# Patient Record
Sex: Male | Born: 1985 | Race: Black or African American | Hispanic: No | Marital: Single | Smoking: Current every day smoker
Health system: Southern US, Community
[De-identification: ages and names within clinical notes are randomized; demographics above are authoritative.]

---

## 2019-10-01 ENCOUNTER — Other Ambulatory Visit: Payer: Self-pay

## 2019-10-01 ENCOUNTER — Ambulatory Visit (HOSPITAL_COMMUNITY)
Admission: EM | Admit: 2019-10-01 | Discharge: 2019-10-01 | Disposition: A | Discharge status: Home / Self Care | Payer: No Payment, Other | Attending: Family | Admitting: Family

## 2019-10-01 DIAGNOSIS — R45851 Suicidal ideations: Secondary | ICD-10-CM | POA: Insufficient documentation

## 2019-10-01 DIAGNOSIS — Z5900 Homelessness unspecified: Secondary | ICD-10-CM

## 2019-10-01 NOTE — Discharge Instructions (Addendum)
°  Follow-up with [facility] in [place]. Discussed safety plan and crisis options, including dialing 911 for urgent needs.

## 2019-10-01 NOTE — ED Provider Notes (Signed)
Behavioral Health Medical Screening Exam  Cory Horton is a 34 y.o. male.  Patient arrives to [facility] behavioral urgent care for walk-in assessment, patient voluntary. Patient reports frustration that he was seen by [facility] social services today and promised housing but he remains homeless. Patient reports chronic suicidal ideations, states "I have thought about having the police shooting kill me because I would never kill myself."  Patient reports "I have felt this way for many years." Patient denies homicidal ideations.  Patient denies auditory and visual hallucinations.  Patient denies symptoms of paranoia. Patient reports he is currently homeless in [place].  Patient denies access to weapons.  Patient reports he is currently employed at "[employer]."  Patient denies alcohol and substance use. Patient reports he was recently discharged from prison after approximately 9 years.  Patient reports he speaks with his psychotherapist every Thursday.  Patient reports he looks forward to therapy on Thursdays as it is a positive in his life. Patient continues to ruminate about homeless status. Patient denies any natural supports for collateral information in the area. Patient contracts verbally for safety.  Patient future oriented, states he plans to report to work tomorrow.   Total Time spent with patient: 45 minutes  Psychiatric Specialty Exam  Presentation  General Appearance:Casual  Eye Contact:Good  Speech:Normal Rate  Speech Volume:Increased  Handedness:Right   Mood and Affect  Mood:Labile  Affect:Labile   Thought Process  Thought Processes:Coherent;Goal Directed  Descriptions of Associations:Intact  Orientation:Full (Time, Place and Person)  Thought Content:WDL  Hallucinations:None  Ideas of Reference:None  Suicidal Thoughts:Yes, Passive Without Intent  Homicidal Thoughts:No   Sensorium  Memory:Immediate Good;Remote  Good  Judgment:Fair  Insight:Fair   Executive Functions  Concentration:Good  Attention Span:Fair  Recall:Fair  Fund of Knowledge:Fair  Language:Fair   Psychomotor Activity  Psychomotor Activity:No data recorded  Assets  Assets:Desire for Improvement;Physical Health;Resilience;Social Support   Sleep  Sleep:Fair  Number of hours: No data recorded  Physical Exam: Physical Exam Vitals and nursing note reviewed.  Constitutional:      Appearance: He is well-developed.  HENT:     Head: Normocephalic and atraumatic.  Eyes:     Conjunctiva/sclera: Conjunctivae normal.  Cardiovascular:     Rate and Rhythm: Normal rate and regular rhythm.     Heart sounds: No murmur heard.   Pulmonary:     Effort: Pulmonary effort is normal. No respiratory distress.     Breath sounds: Normal breath sounds.  Abdominal:     Palpations: Abdomen is soft.     Tenderness: There is no abdominal tenderness.  Musculoskeletal:     Cervical back: Neck supple.  Skin:    General: Skin is warm and dry.  Neurological:     Mental Status: He is alert.  Psychiatric:        Attention and Perception: Attention and perception normal.        Mood and Affect: Affect is labile.        Speech: Speech is rapid and pressured.        Behavior: Behavior normal. Behavior is cooperative.        Thought Content: Thought content normal.        Cognition and Memory: Cognition normal.        Judgment: Judgment normal.    Review of Systems  Constitutional: Negative.   HENT: Negative.   Eyes: Negative.   Respiratory: Negative.   Cardiovascular: Negative.   Gastrointestinal: Negative.   Genitourinary: Negative.   Musculoskeletal: Negative.  Skin: Negative.   Neurological: Negative.   Endo/Heme/Allergies: Negative.   Psychiatric/Behavioral: Positive for suicidal ideas.   Blood pressure 120/90, pulse 66, temperature 98 F (36.7 C), temperature source Oral, resp. rate 18, SpO2 99 %. There is no height  or weight on file to calculate BMI.  Musculoskeletal: Strength & Muscle Tone: within normal limits Gait & Station: normal Patient leans: N/A   Recommendations: Patient discussed with Dr. Dwyane Dee.   Patient given bus pass and encouraged to [facility]. Patient verbalizes plan to follow-up with [facility] ending homelessness.  Patient denies further needs.  Based on my evaluation the patient does not appear to have an emergency medical condition.  Emmaline Kluver, FNP 10/01/2019, 5:13 PM
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
## 2019-10-02 ENCOUNTER — Ambulatory Visit (HOSPITAL_COMMUNITY): Admission: EM | Admit: 2019-10-02 | Discharge: 2019-10-02 | Discharge status: Against Medical Advice | Payer: Self-pay

## 2019-10-02 ENCOUNTER — Emergency Department (HOSPITAL_COMMUNITY)
Admission: EM | Admit: 2019-10-02 | Discharge: 2019-10-02 | Disposition: A | Discharge status: Home / Self Care | Payer: Self-pay | Attending: Emergency Medicine | Admitting: Emergency Medicine

## 2019-10-02 ENCOUNTER — Other Ambulatory Visit: Payer: Self-pay

## 2019-10-02 ENCOUNTER — Encounter (HOSPITAL_COMMUNITY): Payer: Self-pay | Admitting: Emergency Medicine

## 2019-10-02 DIAGNOSIS — Z5900 Homelessness unspecified: Secondary | ICD-10-CM

## 2019-10-02 DIAGNOSIS — R451 Restlessness and agitation: Secondary | ICD-10-CM | POA: Insufficient documentation

## 2019-10-02 DIAGNOSIS — F918 Other conduct disorders: Secondary | ICD-10-CM | POA: Insufficient documentation

## 2019-10-02 DIAGNOSIS — R4689 Other symptoms and signs involving appearance and behavior: Secondary | ICD-10-CM
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
## 2019-10-02 NOTE — Progress Notes (Signed)
TOC CM referral received for homelessness. Attempted to contact pt via phone, no number listed. Cory Donning RN CCM, [facility] ED TOC CM [phone]

## 2019-10-02 NOTE — ED Notes (Signed)
ED Provider at bedside. 
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
## 2019-10-02 NOTE — Discharge Instructions (Signed)
Please return to the emergency department if you feel any worsening of your physical or mental health.

## 2019-10-02 NOTE — ED Notes (Signed)
Pt left without paperwork

## 2019-10-23 ENCOUNTER — Emergency Department (HOSPITAL_COMMUNITY)
Admission: EM | Admit: 2019-10-23 | Discharge: 2019-10-23 | Disposition: A | Discharge status: Home / Self Care | Payer: Self-pay | Attending: Emergency Medicine | Admitting: Emergency Medicine

## 2019-10-23 ENCOUNTER — Emergency Department (HOSPITAL_COMMUNITY): Payer: Self-pay

## 2019-10-23 DIAGNOSIS — Z046 Encounter for general psychiatric examination, requested by authority: Secondary | ICD-10-CM | POA: Insufficient documentation

## 2019-10-23 DIAGNOSIS — Y999 Unspecified external cause status: Secondary | ICD-10-CM | POA: Insufficient documentation

## 2019-10-23 DIAGNOSIS — S0990XA Unspecified injury of head, initial encounter: Secondary | ICD-10-CM | POA: Insufficient documentation

## 2019-10-23 DIAGNOSIS — Z7289 Other problems related to lifestyle: Secondary | ICD-10-CM | POA: Insufficient documentation

## 2019-10-23 DIAGNOSIS — X838XXA Intentional self-harm by other specified means, initial encounter: Secondary | ICD-10-CM | POA: Insufficient documentation

## 2019-10-23 DIAGNOSIS — T1491XA Suicide attempt, initial encounter: Secondary | ICD-10-CM | POA: Insufficient documentation

## 2019-10-23 DIAGNOSIS — R451 Restlessness and agitation: Secondary | ICD-10-CM | POA: Insufficient documentation

## 2019-10-23 DIAGNOSIS — F918 Other conduct disorders: Secondary | ICD-10-CM | POA: Insufficient documentation

## 2019-10-23 DIAGNOSIS — Y92538 Other ambulatory health services establishments as the place of occurrence of the external cause: Secondary | ICD-10-CM | POA: Insufficient documentation

## 2019-10-23 DIAGNOSIS — Y9389 Activity, other specified: Secondary | ICD-10-CM | POA: Insufficient documentation

## 2019-10-23 DIAGNOSIS — R45851 Suicidal ideations: Secondary | ICD-10-CM | POA: Insufficient documentation

## 2019-10-23 LAB — COMPREHENSIVE METABOLIC PANEL
ALT: 18 U/L (ref 0–44)
AST: 18 U/L (ref 15–41)
Albumin: 3.9 g/dL (ref 3.5–5.0)
Alkaline Phosphatase: 56 U/L (ref 38–126)
Anion gap: 11 (ref 5–15)
BUN: 7 mg/dL (ref 6–20)
CO2: 26 mmol/L (ref 22–32)
Calcium: 9.1 mg/dL (ref 8.9–10.3)
Chloride: 102 mmol/L (ref 98–111)
Creatinine, Ser: 1.39 mg/dL — ABNORMAL HIGH (ref 0.61–1.24)
GFR calc Af Amer: >60 mL/min (ref >60)
GFR calc non Af Amer: >60 mL/min (ref >60)
Glucose, Bld: 102 mg/dL — ABNORMAL HIGH (ref 70–99)
Potassium: 3.5 mmol/L (ref 3.5–5.1)
Sodium: 139 mmol/L (ref 135–145)
Total Bilirubin: 1.5 mg/dL — ABNORMAL HIGH (ref 0.3–1.2)
Total Protein: 7.2 g/dL (ref 6.5–8.1)

## 2019-10-23 LAB — CBC
HCT: 47.5 % (ref 39.0–52.0)
Hemoglobin: 16.3 g/dL (ref 13.0–17.0)
MCH: 31.6 pg (ref 26.0–34.0)
MCHC: 34.3 g/dL (ref 30.0–36.0)
MCV: 92.1 fL (ref 80.0–100.0)
Platelets: 207 10*3/uL (ref 150–400)
RBC: 5.16 MIL/uL (ref 4.22–5.81)
RDW: 13.6 % (ref 11.5–15.5)
WBC: 7.2 10*3/uL (ref 4.0–10.5)
nRBC: 0 % (ref 0.0–0.2)

## 2019-10-23 LAB — SALICYLATE LEVEL: Salicylate Lvl: <7 mg/dL — ABNORMAL LOW (ref 7.0–30.0)

## 2019-10-23 LAB — ETHANOL: Alcohol, Ethyl (B): <10 mg/dL (ref <10)

## 2019-10-23 LAB — ACETAMINOPHEN LEVEL: Acetaminophen (Tylenol), Serum: <10 ug/mL — ABNORMAL LOW (ref 10–30)

## 2019-10-23 MED ORDER — ZIPRASIDONE MESYLATE 20 MG IM SOLR
20.0000 mg | Freq: Once | INTRAMUSCULAR | Status: AC
  Administered 2019-10-23: 20 mg via INTRAMUSCULAR
  Filled 2019-10-23: qty 20

## 2019-10-23 MED ORDER — STERILE WATER FOR INJECTION IJ SOLN
INTRAMUSCULAR | Status: AC
  Filled 2019-10-23: qty 10

## 2019-10-23 MED ORDER — LORAZEPAM 2 MG/ML IJ SOLN: 0.5000 mg | Freq: Once | INTRAMUSCULAR | Status: DC

## 2019-10-23 MED ORDER — LORAZEPAM 2 MG/ML IJ SOLN
2.0000 mg | Freq: Once | INTRAMUSCULAR | Status: AC
  Administered 2019-10-23: 2 mg via INTRAMUSCULAR
  Filled 2019-10-23: qty 1

## 2019-10-23 NOTE — ED Notes (Signed)
IVC resended
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
## 2019-10-23 NOTE — ED Notes (Signed)
Patient informed of plan for medication prior to CT scan. Patient uncooperative, shouting and threatening staff and police.
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
## 2019-10-23 NOTE — ED Notes (Signed)
Discharge instructions discussed with pt. Pt verbalized understanding with no questions at this time.   Belongings removed prior to this RN arrival by [employer].   Pt discharged at this time in [employer] custody. ambulatory at discharge.

## 2019-10-23 NOTE — ED Triage Notes (Signed)
Pt here in custody of cops with SI thought and aggressive behavior , pt is voluntary at this point but they are working on [employer] paper work
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
## 2019-10-23 NOTE — ED Notes (Signed)
Pt transported to CT, with RN, Sitter, and 2 [employer] officers
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
## 2019-10-23 NOTE — ED Notes (Signed)
Pt admits to trying to take his shirt off and hang himself ,

## 2019-10-23 NOTE — ED Notes (Addendum)
When attempting to medicate patient, from a supine position, patient jumped over side rail of stretcher onto floor. Multiple [employer] officers and security required to assist in positioning patient.

## 2021-07-04 IMAGING — CT CT HEAD W/O CM
4 series · 15 of 47 positions shown, 17 images · non-contrast
Comparison: None.

CLINICAL DATA: Trauma, headache, suicidal ideation

EXAM:
CT HEAD WITHOUT CONTRAST
TECHNIQUE: Contiguous axial images were obtained from the base of the skull
through the vertex without intravenous contrast.

[Series 2: head wo · axial · 0.35mm/px · z∈[+957,+1096]mm · 7 of 39 slices shown, 9 images]
[im 5/39  brain]
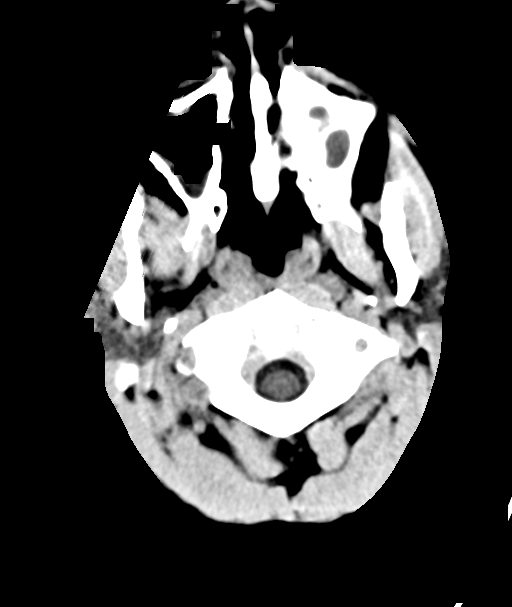
[im 5/39  bone]
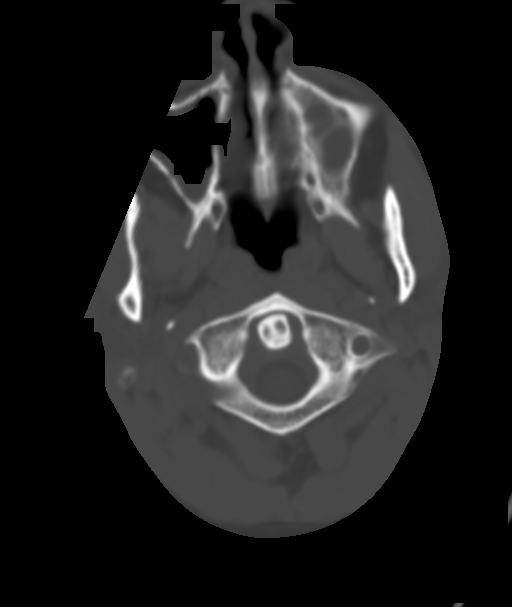
[im 10/39  brain]
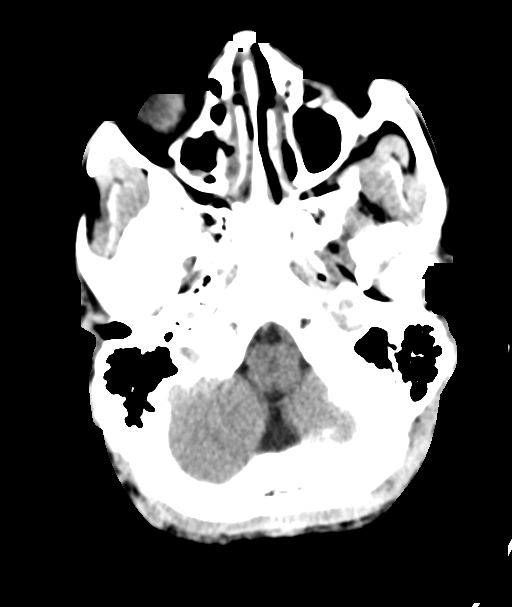
[im 15/39  brain]
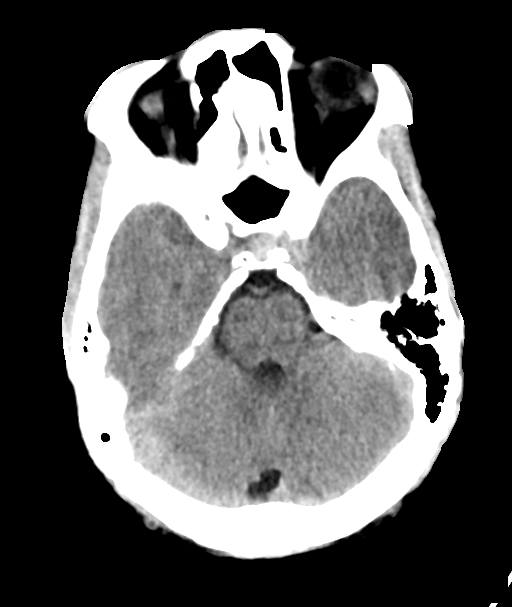
[im 20/39  brain]
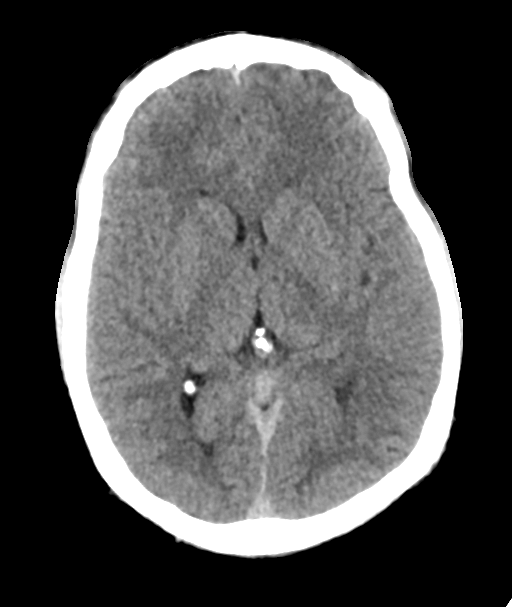
[im 24/39  brain]
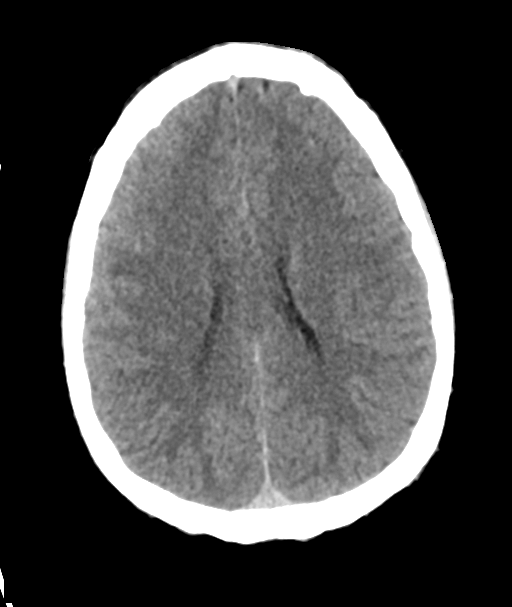
[im 24/39  bone]
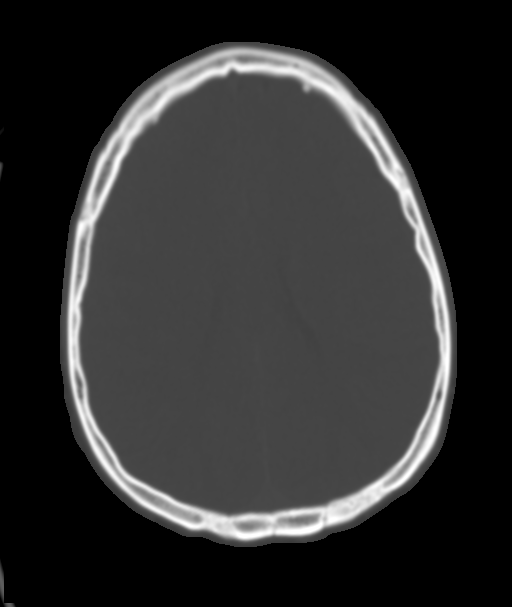
[im 29/39  brain]
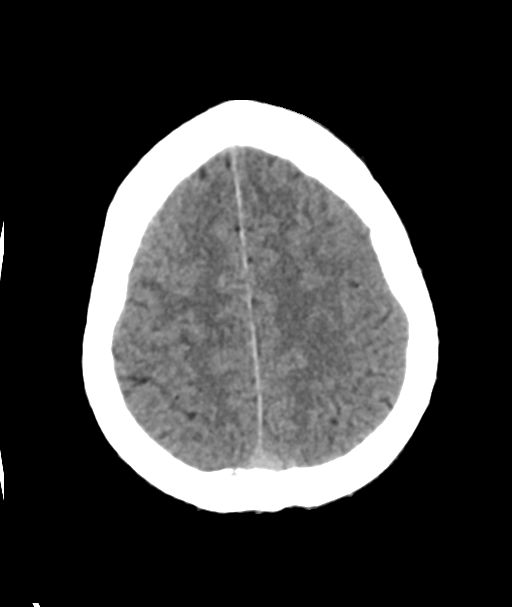
[im 34/39  brain]
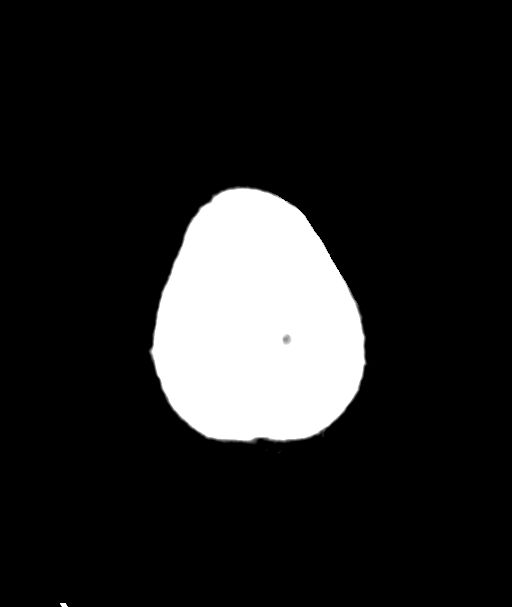

[Series 3: head bone · axial · 0.47mm/px · z∈[+997,+1015]mm · 2 of 92 slices shown]
[im 10/92  bone]
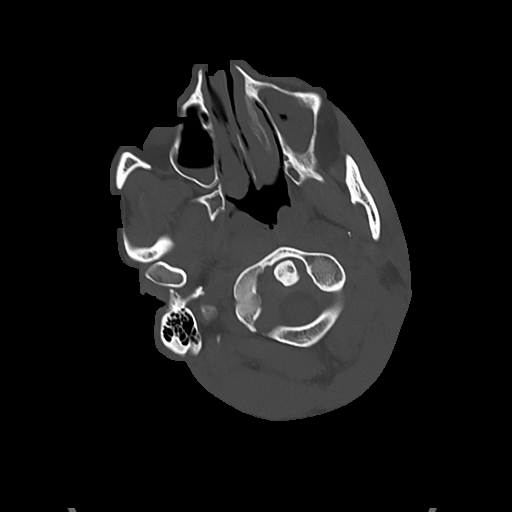
[im 19/92  bone]
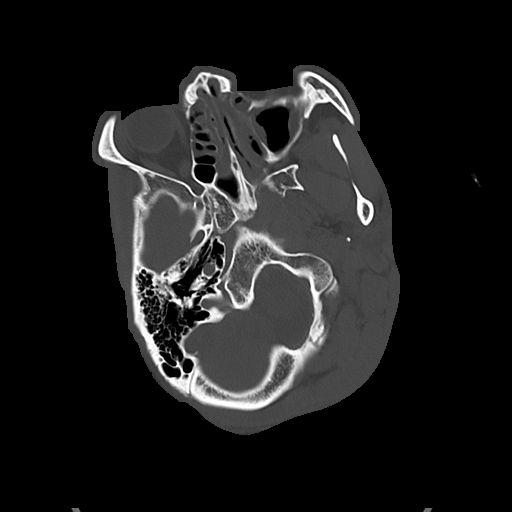

[Series 4: cor soft · coronal · 0.35mm/px · 3 of 78 slices shown]
[im 26/78  brain]
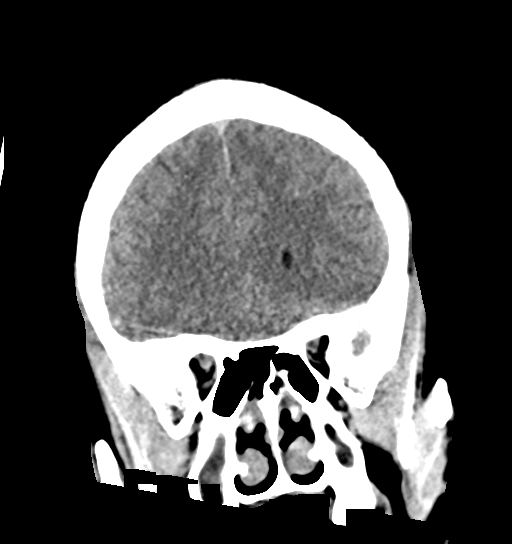
[im 35/78  brain]
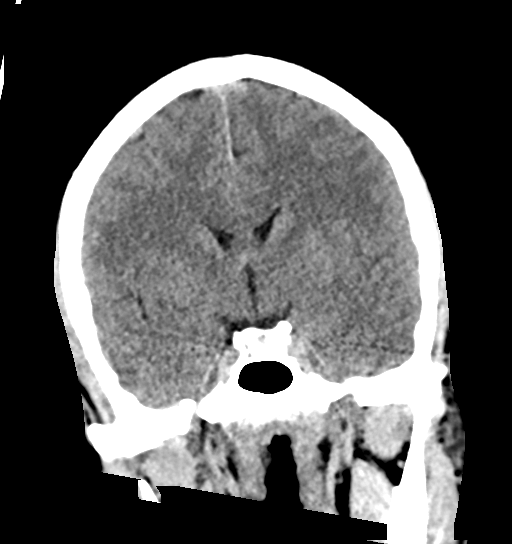
[im 43/78  brain]
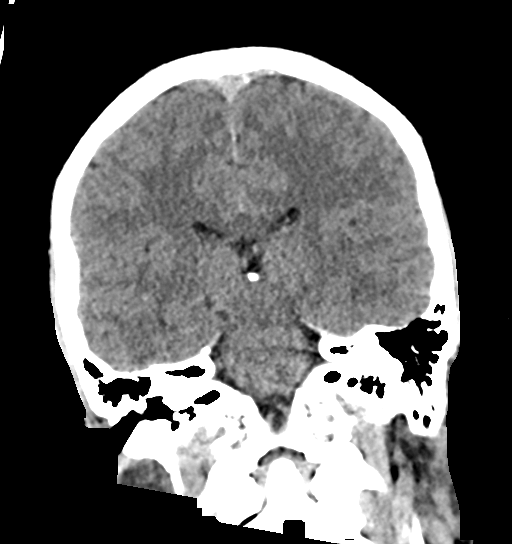

[Series 5: sag soft · sagittal · 0.36mm/px · 3 of 57 slices shown]
[im 26/57  brain]
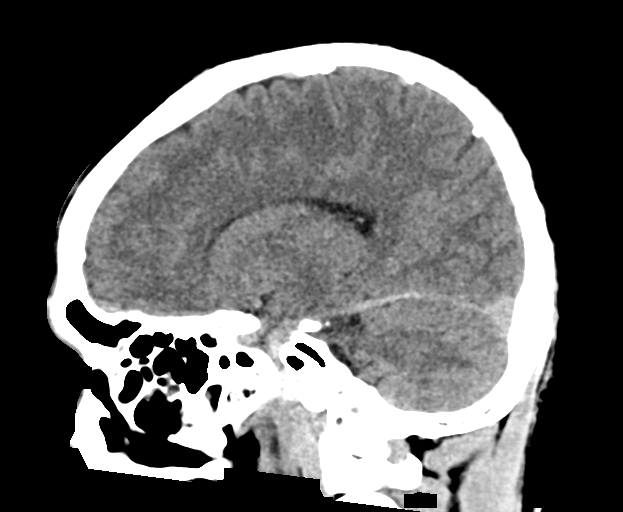
[im 30/57  brain]
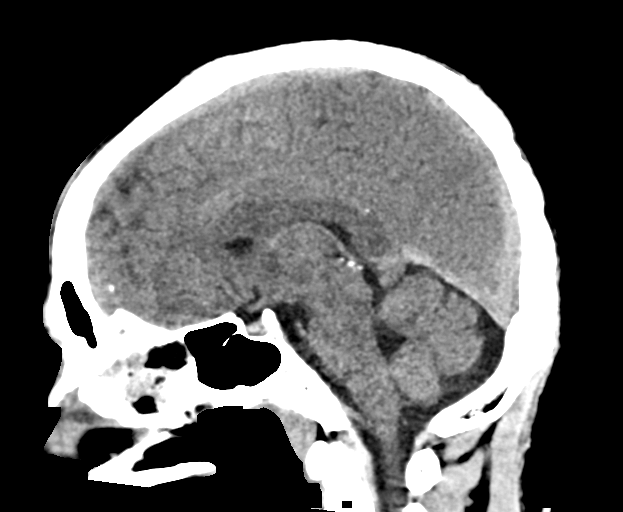
[im 33/57  brain]
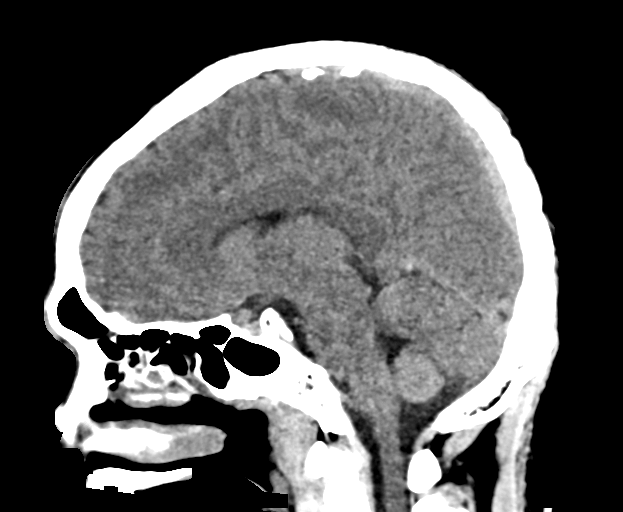

[15 of 47 positions shown; findings below may reference images not displayed]

FINDINGS: Brain: No acute infarct or hemorrhage. Lateral ventricles and
midline structures are unremarkable. No acute extra-axial fluid
collections. No mass effect.

Vascular: No hyperdense vessel or unexpected calcification.

Skull: Normal. Negative for fracture or focal lesion.

Sinuses/Orbits: There is mucoperiosteal thickening throughout the
visualized paranasal sinuses, with relative sparing of the frontal
and sphenoid sinuses.

Other: None.
IMPRESSION: 1. No acute intracranial process.
2. Diffuse paranasal sinus disease.

## 2023-01-23 ENCOUNTER — Other Ambulatory Visit (HOSPITAL_COMMUNITY): Payer: Self-pay

## 2023-01-23 MED ORDER — AMPHETAMINE-DEXTROAMPHET ER 20 MG PO CP24
20.0000 mg | ORAL_CAPSULE | Freq: Every day | ORAL | 0 refills | Status: DC
  Filled 2023-01-23 – 2023-02-21 (×2): qty 30, 30d supply, fill #0

## 2023-02-04 ENCOUNTER — Other Ambulatory Visit (HOSPITAL_COMMUNITY): Payer: Self-pay

## 2023-02-12 ENCOUNTER — Encounter (HOSPITAL_COMMUNITY): Payer: Self-pay | Admitting: Emergency Medicine

## 2023-02-12 ENCOUNTER — Ambulatory Visit (HOSPITAL_COMMUNITY)
Admission: EM | Admit: 2023-02-12 | Discharge: 2023-02-12 | Disposition: A | Discharge status: Home / Self Care | Payer: Self-pay | Attending: Internal Medicine | Admitting: Internal Medicine

## 2023-02-12 DIAGNOSIS — R1111 Vomiting without nausea: Secondary | ICD-10-CM

## 2023-02-12 MED ORDER — IBUPROFEN 800 MG PO TABS
ORAL_TABLET | ORAL | Status: AC
  Filled 2023-02-12: qty 1

## 2023-02-12 MED ORDER — IBUPROFEN 800 MG PO TABS
800.0000 mg | ORAL_TABLET | Freq: Once | ORAL | Status: AC
  Administered 2023-02-12: 800 mg via ORAL

## 2023-02-12 NOTE — Discharge Instructions (Addendum)
Symptoms likely from eating leftovers that were too old. As vomiting symptoms already resolved, then this is likely already worked out of your system. Recommend ibuprofen for headache. Keep a bland diet over the next 2 days and stay hydrated.   Return to urgent care or PCP if symptoms worsen or fail to resolve.

## 2023-02-12 NOTE — ED Provider Notes (Signed)
[facility]    CSN: [id number] Arrival date & time: 02/12/23  1418      History   Chief Complaint Chief Complaint  Patient presents with   Emesis    HPI Cory Horton is a 37 y.o. male.   37 yr old male who presents to urgent care with complaints of vomiting 2 times this morning followed by headache. Threw up after getting up this morning. Threw up food from last night and feels like this might have caused him to vomiting. It was leftovers that had been in the fridge for several days. Got a headache after throwing up. The vomiting has gone away but still has a mild headache. Went to work but was told to go home and get checked out due to his symptoms. No known sick contacts.    Emesis Associated symptoms: headaches   Associated symptoms: no abdominal pain, no arthralgias, no chills, no cough, no fever and no sore throat     History reviewed. No pertinent past medical history.  There are no problems to display for this patient.   History reviewed. No pertinent surgical history.     Home Medications    Prior to Admission medications   Medication Sig Start Date End Date Taking? Authorizing Provider  amphetamine-dextroamphetamine (ADDERALL XR) 20 MG 24 hr capsule Take 1 capsule (20 mg total) by mouth daily. Patient not taking: Reported on 02/12/2023 01/23/23       Family History History reviewed. No pertinent family history.  Social History Social History   Tobacco Use   Smoking status: Every Day    Types: Cigarettes   Smokeless tobacco: Never  Vaping Use   Vaping status: Never Used  Substance Use Topics   Alcohol use: Yes    Comment: occasionally   Drug use: Never     Allergies   Patient has no known allergies.   Review of Systems Review of Systems  Constitutional:  Negative for chills and fever.  HENT:  Negative for ear pain and sore throat.   Eyes:  Negative for pain and visual disturbance.  Respiratory:  Negative for cough and  shortness of breath.   Cardiovascular:  Negative for chest pain and palpitations.  Gastrointestinal:  Positive for vomiting. Negative for abdominal pain.  Genitourinary:  Negative for dysuria and hematuria.  Musculoskeletal:  Negative for arthralgias and back pain.  Skin:  Negative for color change and rash.  Neurological:  Positive for headaches. Negative for seizures and syncope.  All other systems reviewed and are negative.    Physical Exam Triage Vital Signs ED Triage Vitals  Encounter Vitals Group     BP 02/12/23 1531 117/78     Systolic BP Percentile --      Diastolic BP Percentile --      Pulse Rate 02/12/23 1531 60     Resp 02/12/23 1531 16     Temp 02/12/23 1531 97.6 F (36.4 C)     Temp Source 02/12/23 1531 Oral     SpO2 02/12/23 1531 98 %     Weight --      Height --      Head Circumference --      Peak Flow --      Pain Score 02/12/23 1529 4     Pain Loc --      Pain Education --      Exclude from Growth Chart --    No data found.  Updated Vital Signs BP 117/78 (BP Location:  Right Arm)   Pulse 60   Temp 97.6 F (36.4 C) (Oral)   Resp 16   SpO2 98%   Visual Acuity Right Eye Distance:   Left Eye Distance:   Bilateral Distance:    Right Eye Near:   Left Eye Near:    Bilateral Near:     Physical Exam Vitals and nursing note reviewed.  Constitutional:      General: He is not in acute distress.    Appearance: He is well-developed.  HENT:     Head: Normocephalic and atraumatic.  Eyes:     Conjunctiva/sclera: Conjunctivae normal.  Cardiovascular:     Rate and Rhythm: Normal rate and regular rhythm.     Heart sounds: No murmur heard. Pulmonary:     Effort: Pulmonary effort is normal. No respiratory distress.     Breath sounds: Normal breath sounds.  Abdominal:     Palpations: Abdomen is soft.     Tenderness: There is no abdominal tenderness.  Musculoskeletal:        General: No swelling.     Cervical back: Neck supple.  Skin:    General:  Skin is warm and dry.     Capillary Refill: Capillary refill takes less than 2 seconds.  Neurological:     Mental Status: He is alert.  Psychiatric:        Mood and Affect: Mood normal.      UC Treatments / Results  Labs (all labs ordered are listed, but only abnormal results are displayed) Labs Reviewed - No data to display  EKG   Radiology No results found.  Procedures Procedures (including critical care time)  Medications Ordered in UC Medications - No data to display  Initial Impression / Assessment and Plan / UC Course  I have reviewed the triage vital signs and the nursing notes.  Pertinent labs & imaging results that were available during my care of the patient were reviewed by me and considered in my medical decision making (see chart for details).     Vomiting without nausea, unspecified vomiting type   Symptoms likely from eating leftovers that were too old. As vomiting symptoms already resolved, then this is likely already worked out of your system. Recommend ibuprofen for headache. Keep a bland diet over the next 2 days and stay hydrated.   Return to urgent care or PCP if symptoms worsen or fail to resolve.   Final Clinical Impressions(s) / UC Diagnoses   Final diagnoses:  None   Discharge Instructions   None    ED Prescriptions   None    PDMP not reviewed this encounter.   Landis Martins, [place] 02/12/23 1549

## 2023-02-12 NOTE — ED Triage Notes (Signed)
Pt c/o headache and vomiting 2  times today. Pt was at work and was sent home early. Since then he has not vomited and headache is better with ibuprofen which he took 40 mins ago

## 2023-02-21 ENCOUNTER — Other Ambulatory Visit (HOSPITAL_COMMUNITY): Payer: Self-pay

## 2023-04-08 ENCOUNTER — Other Ambulatory Visit (HOSPITAL_COMMUNITY): Payer: Self-pay

## 2023-04-08 MED ORDER — AMPHETAMINE-DEXTROAMPHET ER 20 MG PO CP24
20.0000 mg | ORAL_CAPSULE | Freq: Every day | ORAL | 0 refills | Status: DC
  Filled 2023-04-08 – 2023-05-06 (×2): qty 30, 30d supply, fill #0

## 2023-04-18 ENCOUNTER — Other Ambulatory Visit (HOSPITAL_COMMUNITY): Payer: Self-pay

## 2023-05-06 ENCOUNTER — Other Ambulatory Visit (HOSPITAL_COMMUNITY): Payer: Self-pay

## 2023-05-12 ENCOUNTER — Ambulatory Visit
Admission: EM | Admit: 2023-05-12 | Discharge: 2023-05-12 | Disposition: A | Discharge status: Home / Self Care | Payer: Self-pay | Attending: Physician Assistant | Admitting: Physician Assistant

## 2023-05-12 ENCOUNTER — Ambulatory Visit (INDEPENDENT_AMBULATORY_CARE_PROVIDER_SITE_OTHER): Payer: Self-pay

## 2023-05-12 DIAGNOSIS — S60221A Contusion of right hand, initial encounter: Secondary | ICD-10-CM

## 2023-05-12 DIAGNOSIS — M7989 Other specified soft tissue disorders: Secondary | ICD-10-CM

## 2023-05-12 MED ORDER — IBUPROFEN 600 MG PO TABS: 600.0000 mg | ORAL_TABLET | Freq: Three times a day (TID) | ORAL | 0 refills | Status: AC | PRN

## 2023-05-12 NOTE — ED Provider Notes (Signed)
[facility]    CSN: [id number] Arrival date & time: 05/12/23  1535      History   Chief Complaint No chief complaint on file.   HPI Cory Horton is a 38 y.o. male.   Patient presents today with a 1 day history of right hand swelling and pain.  He reports that he was playing football with some of his friends when he tripped and hit his hand on a wall.  He has had ongoing pain since that time that is currently rated 4/5 on a 0-10 pain scale, described as aching, no aggravating or alleviating factors identified.  He has taken Tylenol and ibuprofen without improvement.  Denies previous injury or surgery involving his hand.  Denies any numbness or paresthesias.  He is right-handed.  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    History reviewed. No pertinent past medical history.  There are no active problems to display for this patient.   History reviewed. No pertinent surgical history.     Home Medications    Prior to Admission medications   Medication Sig Start Date End Date Taking? Authorizing Provider  ibuprofen (ADVIL) 600 MG tablet Take 1 tablet (600 mg total) by mouth every 8 (eight) hours as needed. 05/12/23  Yes Ark Agrusa K, PA-C  amphetamine-dextroamphetamine (ADDERALL XR) 20 MG 24 hr capsule Take 1 capsule (20 mg total) by mouth daily. 04/08/23       Family History History reviewed. No pertinent family history.  Social History Social History   Tobacco Use   Smoking status: Every Day    Types: Cigarettes   Smokeless tobacco: Never  Vaping Use   Vaping status: Never Used  Substance Use Topics   Alcohol use: Yes    Comment: occasionally   Drug use: Never     Allergies   Patient has no known allergies.   Review of Systems Review of Systems  Constitutional:  Positive for activity change. Negative for appetite change, fatigue and fever.  Musculoskeletal:  Positive for  arthralgias and joint swelling. Negative for myalgias.  Skin:  Negative for color change and wound.  Neurological:  Negative for weakness and numbness.     Physical Exam Triage Vital Signs ED Triage Vitals  Encounter Vitals Group     BP 05/12/23 1606 129/79     Systolic BP Percentile --      Diastolic BP Percentile --      Pulse Rate 05/12/23 1606 66     Resp 05/12/23 1606 17     Temp 05/12/23 1606 98.3 F (36.8 C)     Temp Source 05/12/23 1606 Oral     SpO2 05/12/23 1606 96 %     Weight --      Height --      Head Circumference --      Peak Flow --      Pain Score 05/12/23 1605 5     Pain Loc --      Pain Education --      Exclude from Growth Chart --    No data found.  Updated Vital Signs BP 129/79 (BP Location: Right Arm)   Pulse 66   Temp 98.3 F (36.8 C) (Oral)   Resp 17   SpO2 96%   Visual Acuity Right Eye Distance:   Left Eye Distance:   Bilateral Distance:    Right Eye Near:  Left Eye Near:    Bilateral Near:     Physical Exam Vitals reviewed.  Constitutional:      General: He is awake.     Appearance: Normal appearance. He is well-developed. He is not ill-appearing.     Comments: Very pleasant male appears stated age in no acute distress sitting comfortably in exam room  HENT:     Head: Normocephalic and atraumatic.  Cardiovascular:     Rate and Rhythm: Normal rate and regular rhythm.     Heart sounds: Normal heart sounds, S1 normal and S2 normal. No murmur heard.    Comments: Capillary fill within 2 seconds right fingers Pulmonary:     Effort: Pulmonary effort is normal.     Breath sounds: Normal breath sounds. No stridor. No wheezing, rhonchi or rales.     Comments: Clear to auscultation bilaterally Musculoskeletal:     Right hand: Swelling, tenderness and bony tenderness present. No deformity. Decreased range of motion. There is no disruption of two-point discrimination. Normal capillary refill.     Comments: Right hand: Tenderness and  swelling over second and third metacarpal and MCP joint.  No deformity noted.  Decreased flexion of index and middle finger secondary to pain.  Normal to point discrimination.  Neurological:     Mental Status: He is alert.  Psychiatric:        Behavior: Behavior is cooperative.      UC Treatments / Results  Labs (all labs ordered are listed, but only abnormal results are displayed) Labs Reviewed - No data to display  EKG   Radiology No results found.  Procedures Procedures (including critical care time)  Medications Ordered in UC Medications - No data to display  Initial Impression / Assessment and Plan / UC Course  I have reviewed the triage vital signs and the nursing notes.  Pertinent labs & imaging results that were available during my care of the patient were reviewed by me and considered in my medical decision making (see chart for details).     Patient is well-appearing, afebrile, nontoxic, nontachycardic.  X-ray was obtained given recent trauma with associated bony tenderness and swelling that showed no acute osseous abnormality based on my primary read.  At the time of discharge we were waiting for radiologist over read and we will contact you if this differs and changes our treatment plan.  Recommended RICE protocol.  He was given ibuprofen for pain relief and we discussed that he is not to take NSAIDs with this medication due to risk of GI bleeding.  He can use Tylenol/acetaminophen as needed for additional pain relief.  Discussed that if symptoms or not improving he should follow-up with orthopedics and was given contact information for local provider with instruction to call to schedule appointment.  Discussed that if anything worsens or changes he is to return for reevaluation.  Strict return precautions given.  Work excuse note provided.  Final Clinical Impressions(s) / UC Diagnoses   Final diagnoses:  Contusion of dorsum of right hand  Swelling of right hand      Discharge Instructions      I do not see a broken bone in your hand and we will contact you if the radiologist sees something I did not.  Keep it elevated and use ice for 15 minutes at a time 3-4 times per day.  Take ibuprofen for pain.  Do not take NSAIDs with this medication including aspirin, ibuprofen/Advil, naproxen/Aleve.  You can use Tylenol/acetaminophen as needed  for breakthrough pain.  Avoid strenuous activity including heavy lifting for the next several days.  Follow-up with orthopedics if your symptoms are not improving.  If anything worsens you have increasing pain, swelling, redness, fever, numbness or tingling in your hand you need to be seen immediately.     ED Prescriptions     Medication Sig Dispense Auth. Provider   ibuprofen (ADVIL) 600 MG tablet Take 1 tablet (600 mg total) by mouth every 8 (eight) hours as needed. 30 tablet Renatha Rosen, Noberto Retort, PA-C      PDMP not reviewed this encounter.   Jeani Hawking, PA-C 05/12/23 1639

## 2023-05-12 NOTE — ED Triage Notes (Signed)
Pt reports right hand injury, with pain and swelling after playing football and running into a wall last night.

## 2023-05-12 NOTE — Discharge Instructions (Signed)
I do not see a broken bone in your hand and we will contact you if the radiologist sees something I did not.  Keep it elevated and use ice for 15 minutes at a time 3-4 times per day.  Take ibuprofen for pain.  Do not take NSAIDs with this medication including aspirin, ibuprofen/Advil, naproxen/Aleve.  You can use Tylenol/acetaminophen as needed for breakthrough pain.  Avoid strenuous activity including heavy lifting for the next several days.  Follow-up with orthopedics if your symptoms are not improving.  If anything worsens you have increasing pain, swelling, redness, fever, numbness or tingling in your hand you need to be seen immediately.

## 2023-05-16 ENCOUNTER — Other Ambulatory Visit (HOSPITAL_COMMUNITY): Payer: Self-pay

## 2023-06-06 ENCOUNTER — Other Ambulatory Visit (HOSPITAL_COMMUNITY): Payer: Self-pay

## 2023-06-06 MED ORDER — AMPHETAMINE-DEXTROAMPHET ER 20 MG PO CP24
20.0000 mg | ORAL_CAPSULE | Freq: Every day | ORAL | 0 refills | Status: AC
  Filled 2023-06-06: qty 30, 30d supply, fill #0

## 2023-06-13 ENCOUNTER — Emergency Department (HOSPITAL_COMMUNITY)
Admission: EM | Admit: 2023-06-13 | Discharge: 2023-06-13 | Disposition: A | Discharge status: Home / Self Care | Payer: Self-pay | Attending: Emergency Medicine | Admitting: Emergency Medicine

## 2023-06-13 ENCOUNTER — Other Ambulatory Visit: Payer: Self-pay

## 2023-06-13 DIAGNOSIS — Z202 Contact with and (suspected) exposure to infections with a predominantly sexual mode of transmission: Secondary | ICD-10-CM | POA: Insufficient documentation

## 2023-06-13 DIAGNOSIS — Z79899 Other long term (current) drug therapy: Secondary | ICD-10-CM | POA: Insufficient documentation

## 2023-06-13 LAB — HIV ANTIBODY (ROUTINE TESTING W REFLEX): HIV Screen 4th Generation wRfx: NONREACTIVE

## 2023-06-13 NOTE — ED Triage Notes (Signed)
 Pt. Stated, I have no symptoms and I want to be checked for everything that has to do with STD.

## 2023-06-13 NOTE — ED Provider Notes (Signed)
 [place] EMERGENCY DEPARTMENT AT [facility] Provider Note   CSN: [id number] Arrival date & time: 06/13/23  1150     History  Chief Complaint  Patient presents with   Exposure to STD    Cory Horton is a 38 y.o. male.   Exposure to STD   38 year old male presents emergency department with request of testing for STD.  Patient states that he recently got out of prison and is sexually active with a male who may be "playing him."  He is concerned that she may be sexually active with another individual who is positive for STDs.  Patient currently without any symptoms.  Denies any complaints.  States that he just wants testing.  No significant pertinent past medical history.  Home Medications Prior to Admission medications   Medication Sig Start Date End Date Taking? Authorizing Provider  amphetamine-dextroamphetamine (ADDERALL XR) 20 MG 24 hr capsule Take 1 capsule (20 mg total) by mouth daily. 06/06/23     ibuprofen (ADVIL) 600 MG tablet Take 1 tablet (600 mg total) by mouth every 8 (eight) hours as needed. 05/12/23   Raspet, Noberto Retort, PA-C      Allergies    Patient has no known allergies.    Review of Systems   Review of Systems  All other systems reviewed and are negative.   Physical Exam Updated Vital Signs BP (!) 140/59 (BP Location: Right Arm)   Pulse 85   Temp 97.9 F (36.6 C)   Resp 16   Ht 5\' 7"  (1.702 m)   Wt 77.1 kg   SpO2 99%   BMI 26.63 kg/m  Physical Exam Vitals and nursing note reviewed.  Constitutional:      General: He is not in acute distress.    Appearance: He is well-developed.  HENT:     Head: Normocephalic and atraumatic.  Eyes:     Conjunctiva/sclera: Conjunctivae normal.  Cardiovascular:     Rate and Rhythm: Normal rate and regular rhythm.     Heart sounds: No murmur heard. Pulmonary:     Effort: Pulmonary effort is normal. No respiratory distress.     Breath sounds: Normal breath sounds. No wheezing, rhonchi or rales.   Abdominal:     Palpations: Abdomen is soft.     Tenderness: There is no abdominal tenderness.  Musculoskeletal:        General: No swelling.     Cervical back: Neck supple.  Skin:    General: Skin is warm and dry.     Capillary Refill: Capillary refill takes less than 2 seconds.  Neurological:     Mental Status: He is alert.  Psychiatric:        Mood and Affect: Mood normal.     ED Results / Procedures / Treatments   Labs (all labs ordered are listed, but only abnormal results are displayed) Labs Reviewed  RPR  HIV ANTIBODY (ROUTINE TESTING W REFLEX)  GC/CHLAMYDIA PROBE AMP ([place]) NOT AT [facility]    EKG None  Radiology No results found.  Procedures Procedures    Medications Ordered in ED Medications - No data to display  ED Course/ Medical Decision Making/ A&P                                 Medical Decision Making Amount and/or Complexity of Data Reviewed Labs: ordered.   This patient presents to the ED for concern of STD  exposure, this involves an extensive number of treatment options, and is a complaint that carries with it a high risk of complications and morbidity.  The differential diagnosis includes gonorrhea, chlamydia, HIV, syphilis, other   Co morbidities that complicate the patient evaluation  See HPI   Additional history obtained:  Additional history obtained from EMR External records from outside source obtained and reviewed including hospital records   Lab Tests:  I Ordered, and personally interpreted labs.  The pertinent results include: GC/committee a, HIV, RPR pending   Imaging Studies ordered:  N/a   Cardiac Monitoring: / EKG:  The patient was maintained on a cardiac monitor.  I personally viewed and interpreted the cardiac monitored which showed an underlying rhythm of: sinus rhythm   Consultations Obtained:  N/a   Problem List / ED Course / Critical interventions / Medication management  STD  testing Reevaluation of the patient showed that the patient stayed the same I have reviewed the patients home medicines and have made adjustments as needed   Social Determinants of Health:  Denies substance use.   Test / Admission - Considered:  STD testing Vitals signs significant for hypertension blood pressure 140/59. Otherwise within normal range and stable throughout visit. Laboratory studies significant for: See above 38 year old male presents emergency department with request of testing for STD.  Patient states that he recently got out of prison and is sexually active with a male who may be "playing him."  He is concerned that she may be sexually active with another individual who is positive for STDs.  Patient currently without any symptoms.  Denies any complaints.  States that he just wants testing. Will obtain STD testing but will refrain from treatment given no known exposure.  Will ask patient to follow MyChart for results and follow-up with primary care for further evaluation if testing positive.  Treatment plan discussed at length with patient and he acknowledged understanding was agreeable to said plan.  Patient overall well-appearing, afebrile in no acute distress. Worrisome signs and symptoms were discussed with the patient, and the patient acknowledged understanding to return to the ED if noticed. Patient was stable upon discharge.          Final Clinical Impression(s) / ED Diagnoses Final diagnoses:  Possible exposure to STD    Rx / DC Orders ED Discharge Orders     None         Peter Garter, [place] 06/13/23 1308    Pricilla Loveless, MD 06/14/23 [phone]

## 2023-06-13 NOTE — Discharge Instructions (Signed)
 As discussed, follow MyChart for the results of your STD testing.  Follow up with primary care or the health department if you test positive.  Please do not hesitate to return to the emergency department if the worrisome signs and symptoms we discussed become apparent.

## 2023-06-14 LAB — GC/CHLAMYDIA PROBE AMP (~~LOC~~) NOT AT ARMC
Chlamydia: NEGATIVE
Comment: NEGATIVE
Comment: NORMAL
Neisseria Gonorrhea: NEGATIVE

## 2023-06-14 LAB — RPR: RPR Ser Ql: NONREACTIVE

## 2023-06-18 ENCOUNTER — Other Ambulatory Visit (HOSPITAL_COMMUNITY): Payer: Self-pay
